# Patient Record
Sex: Female | Born: 1985
Health system: Southern US, Community
[De-identification: ages and names within clinical notes are randomized; demographics above are authoritative.]

---

## 2017-12-19 ENCOUNTER — Other Ambulatory Visit: Payer: Self-pay

## 2017-12-19 ENCOUNTER — Emergency Department (HOSPITAL_COMMUNITY)
Admission: EM | Admit: 2017-12-19 | Discharge: 2017-12-20 | Disposition: A | Discharge status: Home / Self Care | Payer: BLUE CROSS/BLUE SHIELD | Attending: Emergency Medicine | Admitting: Emergency Medicine

## 2017-12-19 ENCOUNTER — Emergency Department (HOSPITAL_COMMUNITY): Payer: BLUE CROSS/BLUE SHIELD

## 2017-12-19 ENCOUNTER — Encounter (HOSPITAL_COMMUNITY): Payer: Self-pay

## 2017-12-19 DIAGNOSIS — R319 Hematuria, unspecified: Secondary | ICD-10-CM | POA: Diagnosis not present

## 2017-12-19 DIAGNOSIS — N390 Urinary tract infection, site not specified: Secondary | ICD-10-CM | POA: Diagnosis not present

## 2017-12-19 DIAGNOSIS — N200 Calculus of kidney: Secondary | ICD-10-CM | POA: Diagnosis not present

## 2017-12-19 DIAGNOSIS — R1030 Lower abdominal pain, unspecified: Secondary | ICD-10-CM | POA: Diagnosis present

## 2017-12-19 LAB — COMPREHENSIVE METABOLIC PANEL
ALBUMIN: 4.4 g/dL (ref 3.5–5.0)
ALK PHOS: 42 U/L (ref 38–126)
ALT: 15 U/L (ref 0–44)
ANION GAP: 11 (ref 5–15)
AST: 19 U/L (ref 15–41)
BUN: 14 mg/dL (ref 6–20)
CALCIUM: 9.5 mg/dL (ref 8.9–10.3)
CHLORIDE: 100 mmol/L (ref 98–111)
CO2: 25 mmol/L (ref 22–32)
Creatinine, Ser: 0.98 mg/dL (ref 0.44–1.00)
GFR calc Af Amer: >60 mL/min (ref >60)
GFR calc non Af Amer: >60 mL/min (ref >60)
GLUCOSE: 124 mg/dL — AB (ref 70–99)
POTASSIUM: 4.4 mmol/L (ref 3.5–5.1)
SODIUM: 136 mmol/L (ref 135–145)
Total Bilirubin: 1 mg/dL (ref 0.3–1.2)
Total Protein: 7.3 g/dL (ref 6.5–8.1)

## 2017-12-19 LAB — URINALYSIS, ROUTINE W REFLEX MICROSCOPIC
Bilirubin Urine: NEGATIVE
Glucose, UA: NEGATIVE mg/dL
KETONES UR: 20 mg/dL — AB
NITRITE: NEGATIVE
PROTEIN: 30 mg/dL — AB
Specific Gravity, Urine: 1.026 (ref 1.005–1.030)
pH: 6 (ref 5.0–8.0)

## 2017-12-19 LAB — LIPASE, BLOOD: Lipase: 23 U/L (ref 11–51)

## 2017-12-19 LAB — CBC
HCT: 38.5 % (ref 36.0–46.0)
HEMOGLOBIN: 12.4 g/dL (ref 12.0–15.0)
MCH: 27.1 pg (ref 26.0–34.0)
MCHC: 32.2 g/dL (ref 30.0–36.0)
MCV: 84.2 fL (ref 78.0–100.0)
Platelets: 296 10*3/uL (ref 150–400)
RBC: 4.57 MIL/uL (ref 3.87–5.11)
RDW: 12.7 % (ref 11.5–15.5)
WBC: 14 10*3/uL — ABNORMAL HIGH (ref 4.0–10.5)

## 2017-12-19 LAB — I-STAT BETA HCG BLOOD, ED (MC, WL, AP ONLY)

## 2017-12-19 MED ORDER — HYDROMORPHONE HCL 1 MG/ML IJ SOLN
0.5000 mg | Freq: Once | INTRAMUSCULAR | Status: AC
  Administered 2017-12-20: 0.5 mg via INTRAVENOUS
  Filled 2017-12-19: qty 1

## 2017-12-19 MED ORDER — SODIUM CHLORIDE 0.9 % IV BOLUS
1000.0000 mL | Freq: Once | INTRAVENOUS | Status: AC
  Administered 2017-12-20: 1000 mL via INTRAVENOUS

## 2017-12-19 MED ORDER — ONDANSETRON HCL 4 MG/2ML IJ SOLN
4.0000 mg | Freq: Once | INTRAMUSCULAR | Status: AC
  Administered 2017-12-19: 4 mg via INTRAVENOUS
  Filled 2017-12-19: qty 2

## 2017-12-19 MED ORDER — SODIUM CHLORIDE 0.9 % IV SOLN
2.0000 g | Freq: Once | INTRAVENOUS | Status: AC
  Administered 2017-12-20: 2 g via INTRAVENOUS
  Filled 2017-12-19: qty 20

## 2017-12-19 MED ORDER — SODIUM CHLORIDE 0.9 % IV SOLN
INTRAVENOUS | Status: DC
  Administered 2017-12-19: 23:00:00 via INTRAVENOUS

## 2017-12-19 NOTE — ED Provider Notes (Signed)
Patient placed in Quick Look pathway, seen and evaluated   Chief Complaint: emesis, back pain  HPI:   Colleen James is a 32 y.o. female who presents to the ED for right side back pain that started early this morning and as the day has gone on the pain radiates to the right abdomen and now n/v. Family hx of kidney stones. Patient went to Urgent Care earlier today and was given medication for n/v. Symptoms have worsened.LMP 11/30/14, no birth control.  ROS: GI: nausea, vomiting, flank pain, right side abdominal pain  Physical Exam:  BP 133/90 (BP Location: Right Arm)   Pulse 96   Temp 98.5 F (36.9 C) (Oral)   Resp 16   Ht 5\' 1"  (1.549 m)   Wt 64 kg (141 lb)   SpO2 100%   BMI 26.64 kg/m    Gen: No distress  Neuro: Awake and Alert  Skin: Warm and dry    Initiation of care has begun. The patient has been counseled on the process, plan, and necessity for staying for the completion/evaluation, and the remainder of the medical screening examination    Janne Napoleoneese, Hope M, NP 12/19/17 2054    Gerhard MunchLockwood, Robert, MD 12/19/17 2348

## 2017-12-19 NOTE — ED Triage Notes (Addendum)
Patient c/o back pain with RLQ abdominal pain with N/V. Went to Urgent Care today and was given abx and nausea medication but has had no relief.

## 2017-12-19 NOTE — ED Provider Notes (Signed)
[facility] EMERGENCY DEPARTMENT Provider Note   CSN: [id number] Arrival date & time: 12/19/17  2043     History   Chief Complaint Chief Complaint  Patient presents with  . Back Pain    HPI Colleen James is a 32 y.o. female.  HPI Colleen James is a 33 y.o. female presents to emergency department complaining right flank pain and lower abdominal pain onset this morning.  She reports associated nausea and vomiting.  Reports some dysuria.  She went to urgent care and was diagnosed with UTI, was prescribed Macrobid, Zofran, naproxen.  Patient states she went home and was trying to take the medications but unable to keep any down.  She states she tried 2 doses of the nausea medicine with no relief.  She states pain continues.  She reports fever and chills.  Denies any vaginal discharge or bleeding.  She does state that she has history of kidney stones, and this feels similar.  No other complaints.  No past medical history on file.  There are no active problems to display for this patient.    The histories are not reviewed yet. Please review them in the "History" navigator section and refresh this SmartLink.   OB History    Gravida  1   Para      Term      Preterm      AB      Living        SAB      TAB      Ectopic      Multiple      Live Births               Home Medications    Prior to Admission medications   Not on File    Family History No family history on file.  Social History Social History   Tobacco Use  . Smoking status: Not on file  Substance Use Topics  . Alcohol use: Not on file  . Drug use: Not on file     Allergies   Ibuprofen   Review of Systems Review of Systems  Constitutional: Negative for chills and fever.  Respiratory: Negative for cough, chest tightness and shortness of breath.   Cardiovascular: Negative for chest pain, palpitations and leg swelling.  Gastrointestinal: Positive for  abdominal pain. Negative for diarrhea, nausea and vomiting.  Genitourinary: Positive for dysuria, flank pain and urgency. Negative for pelvic pain, vaginal bleeding, vaginal discharge and vaginal pain.  Musculoskeletal: Negative for arthralgias, myalgias, neck pain and neck stiffness.  Skin: Negative for rash.  Neurological: Negative for dizziness, weakness and headaches.  All other systems reviewed and are negative.    Physical Exam Updated Vital Signs BP 133/90 (BP Location: Right Arm)   Pulse 96   Temp 98.5 F (36.9 C) (Oral)   Resp 16   Ht 5\' 1"  (1.549 m)   Wt 64 kg (141 lb)   LMP 11/29/2017 (Exact Date)   SpO2 100%   BMI 26.64 kg/m   Physical Exam  Constitutional: She is oriented to person, place, and time. She appears well-developed and well-nourished.  Patient appears to be uncomfortable, tearful  HENT:  Head: Normocephalic.  Eyes: Conjunctivae are normal.  Neck: Neck supple.  Cardiovascular: Normal rate, regular rhythm and normal heart sounds.  Pulmonary/Chest: Effort normal and breath sounds normal. No respiratory distress. She has no wheezes. She has no rales.  Abdominal: Soft. Bowel sounds are normal. She exhibits no distension.  There is tenderness. There is no rebound.  Right lower quadrant tenderness.  Right CVA tenderness.  Musculoskeletal: She exhibits no edema.  Neurological: She is alert and oriented to person, place, and time.  Skin: Skin is warm and dry.  Psychiatric: She has a normal mood and affect. Her behavior is normal.  Nursing note and vitals reviewed.    ED Treatments / Results  Labs (all labs ordered are listed, but only abnormal results are displayed) Labs Reviewed  CBC - Abnormal; Notable for the following components:      Result Value   WBC 14.0 (*)    All other components within normal limits  COMPREHENSIVE METABOLIC PANEL - Abnormal; Notable for the following components:   Glucose, Bld 124 (*)    All other components within normal  limits  URINALYSIS, ROUTINE W REFLEX MICROSCOPIC - Abnormal; Notable for the following components:   APPearance CLOUDY (*)    Hgb urine dipstick MODERATE (*)    Ketones, ur 20 (*)    Protein, ur 30 (*)    Leukocytes, UA LARGE (*)    WBC, UA >50 (*)    Bacteria, UA MANY (*)    All other components within normal limits  URINE CULTURE  LIPASE, BLOOD  I-STAT BETA HCG BLOOD, ED ([facility], [facility], [facility] ONLY)    EKG None  Radiology Ct Renal Stone Study  Result Date: 12/19/2017 CLINICAL DATA:  32 year old female with acute RIGHT flank and abdominal pain with vomiting today. EXAM: CT ABDOMEN AND PELVIS WITHOUT CONTRAST TECHNIQUE: Multidetector CT imaging of the abdomen and pelvis was performed following the standard protocol without IV contrast. COMPARISON:  None. FINDINGS: Please note that parenchymal abnormalities may be missed without intravenous contrast. Lower chest: No acute abnormality. Hepatobiliary: The liver and gallbladder are unremarkable. No biliary dilatation. Pancreas: Unremarkable Spleen: Unremarkable Adrenals/Urinary Tract: A 4 mm RIGHT UVJ calculus causes mild RIGHT hydroureteronephrosis. Punctate nonobstructing bilateral renal calculi and a nonobstructing 5 mm mid RIGHT renal calculus identified. The adrenal glands and LEFT kidney are unremarkable. Stomach/Bowel: Stomach is within normal limits. Appendix appears normal. No evidence of bowel wall thickening, distention, or inflammatory changes. Vascular/Lymphatic: No significant vascular findings are present. No enlarged abdominal or pelvic lymph nodes. Reproductive: Uterus and bilateral adnexa are unremarkable. Other: No ascites, focal collection or pneumoperitoneum. Musculoskeletal: No acute or significant osseous findings. IMPRESSION: 1. 4 mm RIGHT UVJ calculus causing mild RIGHT hydroureteronephrosis. 2. Nonobstructing bilateral renal calculi Electronically Signed   By: Harmon Pier M.D.   On: 12/19/2017 23:24    Procedures Procedures  (including critical care time)  Medications Ordered in ED Medications  0.9 %  sodium chloride infusion ( Intravenous New Bag/Given 12/19/17 2236)  sodium chloride 0.9 % bolus 1,000 mL (has no administration in time range)  HYDROmorphone (DILAUDID) injection 0.5 mg (has no administration in time range)  cefTRIAXone (ROCEPHIN) 2 g in sodium chloride 0.9 % 100 mL IVPB (has no administration in time range)  ondansetron (ZOFRAN) injection 4 mg (4 mg Intravenous Given 12/19/17 2236)     Initial Impression / Assessment and Plan / ED Course  I have reviewed the triage vital signs and the nursing notes.  Pertinent labs & imaging results that were available during my care of the patient were reviewed by me and considered in my medical decision making (see chart for details).     Patient in emergency department with right flank pain and lower abdominal pain onset this morning.  Patient appears to be very uncomfortable.  Diagnosed with UTI at urgent care.  Here her blood work shows white blood cell count of 14, otherwise unremarkable.  Large leukocytes, greater than 50 white blood cells, many bacteria.  Consistent with infectious process.  Patient does also have history of kidney stones, will get CT renal for further evaluation  11:55 PM CT showing right UVJ 4mm stone. Concerning for infected stone. Discussed with urology, will come see pt.   12:37 AM Patient seen by urologist, Dr. Lucretia RoersWood.  He offered her stenting versus discharge home with giving it time to try to pass the stone.  At this time patient is afebrile, nontoxic, normal vital signs patient has chosen to try to go home.  I will give her prescription for Phenergan in addition to her Zofran.. We will start her on Cipro for infection.  We will give her pain medication.  I will have a follow-up closely with urology.  Discussed return precautions such as fever, intractable nausea vomiting, worsening pain.  Patient agreed.  Vitals:   12/19/17 2050  12/19/17 2052  BP: 133/90   Pulse: 96   Resp: 16   Temp: 98.5 F (36.9 C)   TempSrc: Oral   SpO2: 100%   Weight:  64 kg (141 lb)  Height:  5\' 1"  (1.549 m)    Final Clinical Impressions(s) / ED Diagnoses   Final diagnoses:  Kidney stone  Urinary tract infection with hematuria, site unspecified    ED Discharge Orders        Ordered    tamsulosin (FLOMAX) 0.4 MG CAPS capsule  Daily     12/20/17 0039    ciprofloxacin (CIPRO) 500 MG tablet  2 times daily     12/20/17 0039    HYDROcodone-acetaminophen (NORCO) 5-325 MG tablet  Every 6 hours PRN     12/20/17 0039    promethazine (PHENERGAN) 25 MG tablet  Every 6 hours PRN     12/20/17 0039       Jaynie CrumbleKirichenko, Niccolas Loeper, PA-C 12/20/17 0052    Gerhard MunchLockwood, Robert, MD 12/21/17 0028

## 2017-12-20 MED ORDER — PROMETHAZINE HCL 25 MG PO TABS: 25.0000 mg | ORAL_TABLET | Freq: Four times a day (QID) | ORAL | 0 refills | Status: DC | PRN

## 2017-12-20 MED ORDER — HYDROCODONE-ACETAMINOPHEN 5-325 MG PO TABS: 1.0000 | ORAL_TABLET | Freq: Four times a day (QID) | ORAL | 0 refills | Status: DC | PRN

## 2017-12-20 MED ORDER — CIPROFLOXACIN HCL 500 MG PO TABS: 500.0000 mg | ORAL_TABLET | Freq: Two times a day (BID) | ORAL | 0 refills | Status: DC

## 2017-12-20 MED ORDER — TAMSULOSIN HCL 0.4 MG PO CAPS: 0.4000 mg | ORAL_CAPSULE | Freq: Every day | ORAL | 0 refills | Status: DC

## 2017-12-20 NOTE — Consult Note (Addendum)
Urology Consult Note   Requesting Attending Physician:  Gerhard MunchLockwood, Robert, MD Service Providing Consult: Urology  Consulting Attending: Dwana Curded Sheritha Louis, MD   Reason for Consult:  Obstructing nephrolithiasis  HPI: Glennie HawkKalaiyarasi Tennyson is seen in consultation for reasons noted above at the request of Gerhard MunchLockwood, Robert, MD for evaluation of right sided obstructing nephrolithiasis.  This is a 32 y.o. female with history of nephrolithiasis who presents with right sided flank pain and associated nausea/vomiting that began early yesterday morning. She was seen at an outside facility where she was prescribed Macrobid as it was thought she may have a UTI.   She later presented to the ED where a CT scan was obtained which revealed a 4mm obstructing stone at the right UVJ with associated mild hydroureteronephrosis. She was given a dose of CTX and Urology was then consulted for assistance with management.  Patient specifically denies fevers. She also denies dysuria. She does report some urinary frequency/urgency that began today. She describes her pain as well controlled at this time.  Patient reports similar episode several years prior in which she spontaneously passed her stone with the use of Flomax. This has been her only prior stone episode. She reports family history of stones. She does not take any medications.   Past Medical History: No past medical history on file.  Past Surgical History:  The histories are not reviewed yet. Please review them in the "History" navigator section and refresh this SmartLink.  Medication: Current Facility-Administered Medications  Medication Dose Route Frequency Provider Last Rate Last Dose  . 0.9 %  sodium chloride infusion   Intravenous Continuous Kerrie Buffaloeese, Hope M, NP 125 mL/hr at 12/19/17 2236    . cefTRIAXone (ROCEPHIN) 2 g in sodium chloride 0.9 % 100 mL IVPB  2 g Intravenous Once Kirichenko, Tatyana, PA-C 200 mL/hr at 12/20/17 0007 2 g at 12/20/17 0007  . sodium  chloride 0.9 % bolus 1,000 mL  1,000 mL Intravenous Once Jaynie CrumbleKirichenko, Tatyana, PA-C 984 mL/hr at 12/20/17 0006 1,000 mL at 12/20/17 0006   No current outpatient medications on file.    Allergies: Allergies  Allergen Reactions  . Ibuprofen     GI upset    Social History: Social History   Tobacco Use  . Smoking status: Not on file  Substance Use Topics  . Alcohol use: Not on file  . Drug use: Not on file    Family History No family history on file.  Review of Systems 10 systems were reviewed and are negative except as noted specifically in the HPI.  Objective   Vital signs in last 24 hours: BP 133/90 (BP Location: Right Arm)   Pulse 96   Temp 98.5 F (36.9 C) (Oral)   Resp 16   Ht 5\' 1"  (1.549 m)   Wt 64 kg (141 lb)   LMP 11/29/2017 (Exact Date)   SpO2 100%   BMI 26.64 kg/m   Physical Exam General: NAD, A&O, resting, appropriate HEENT: [place]/AT, EOMI, MMM Pulmonary: Normal work of breathing Cardiovascular: HDS, adequate peripheral perfusion Abdomen: Soft, NTTP, nondistended GU: mild RLQ pain, no CVAT Extremities: warm and well perfused Neuro: Appropriate, no focal neurological deficits  Most Recent Labs: Lab Results  Component Value Date   WBC 14.0 (H) 12/19/2017   HGB 12.4 12/19/2017   HCT 38.5 12/19/2017   PLT 296 12/19/2017    Lab Results  Component Value Date   NA 136 12/19/2017   K 4.4 12/19/2017   CL 100 12/19/2017   CO2 25  12/19/2017   BUN 14 12/19/2017   CREATININE 0.98 12/19/2017   CALCIUM 9.5 12/19/2017    No results found for: INR, APTT   IMAGING: Ct Renal Stone Study  Result Date: 12/19/2017 CLINICAL DATA:  32 year old female with acute RIGHT flank and abdominal pain with vomiting today. EXAM: CT ABDOMEN AND PELVIS WITHOUT CONTRAST TECHNIQUE: Multidetector CT imaging of the abdomen and pelvis was performed following the standard protocol without IV contrast. COMPARISON:  None. FINDINGS: Please note that parenchymal abnormalities may  be missed without intravenous contrast. Lower chest: No acute abnormality. Hepatobiliary: The liver and gallbladder are unremarkable. No biliary dilatation. Pancreas: Unremarkable Spleen: Unremarkable Adrenals/Urinary Tract: A 4 mm RIGHT UVJ calculus causes mild RIGHT hydroureteronephrosis. Punctate nonobstructing bilateral renal calculi and a nonobstructing 5 mm mid RIGHT renal calculus identified. The adrenal glands and LEFT kidney are unremarkable. Stomach/Bowel: Stomach is within normal limits. Appendix appears normal. No evidence of bowel wall thickening, distention, or inflammatory changes. Vascular/Lymphatic: No significant vascular findings are present. No enlarged abdominal or pelvic lymph nodes. Reproductive: Uterus and bilateral adnexa are unremarkable. Other: No ascites, focal collection or pneumoperitoneum. Musculoskeletal: No acute or significant osseous findings. IMPRESSION: 1. 4 mm RIGHT UVJ calculus causing mild RIGHT hydroureteronephrosis. 2. Nonobstructing bilateral renal calculi Electronically Signed   By: Harmon Pier M.D.   On: 12/19/2017 23:24    ------  Assessment:  32 y.o. female with history of nephrolithiasis presenting with right sided flank pain and CT evidence of 4mm right UVJ stone with associated mild hydroureteronephrosis. Additionally patient with nonobstructing bilateral renal calculi and a nonobstructing 5 mm interpole right renal calculus.  Patient afebrile and HDS. Non-toxic appearing. Pain well controlled at this time. Mild leukocytosis of 14. UA with negative nitrites, large LE, 21-50 RBCs, >50 WBCs, 11-20 squams, many bacteria. UCx pending. No dysuria. Cr WNL.  Discussed options at this time include intervention with placement of right ureteral stent versus trial of passage. If right ureteral stent placed, she would requre ureteroscopic stone extraction at a later date. We discussed risks and benefits of each option in detail. At this time, she prefers trial of  passage with medical expulsive therapy. Strict return precautions were discussed with patient as described below. She expressed understanding.   Recommendations: - OK to discharge home with PO pain medication, Flomax, aggressive hydration and urine strainer. - Follow up results of UCx, recommend 7 day empiric course of treatment antibiotics pending culture results. - Discussed with patient that strict return precautions include fevers/chills, intractable nausea/vomiting, or pain limiting self care. - Urology to arrange for close outpatient follow up with repeat imaging prior.  Discussed with Dr. Berneice Heinrich.  Thank you for this consult. Please contact the urology consult pager with any further questions/concerns.   Imaging and labs reviewed with Dr. Lucretia Roers. Strong return warning given for new fevers / progresisve malaise othersie proceed with trial of medical therapy given size / location of stone.

## 2017-12-20 NOTE — ED Notes (Signed)
Dr. Wood at bedside

## 2017-12-20 NOTE — ED Notes (Signed)
Signature pad not working, pt verbalized discharge instructions and prescriptions.  No questions at this time.

## 2017-12-20 NOTE — Discharge Instructions (Addendum)
Strain your urine.  Take ibuprofen for pain.  Take Norco for severe pain.  Zofran for nausea and vomiting that was prescribed to you by urgent care.  Phenergan in addition for nausea relief.  Flomax daily to help you pass the stone.  Take Cipro for infection.  Follow-up closely with urologist.  [facility] if fever, unable to keep antibiotics down, worsening pain, any new concerning symptoms

## 2017-12-21 LAB — URINE CULTURE: Culture: NO GROWTH

## 2018-01-26 DIAGNOSIS — L700 Acne vulgaris: Secondary | ICD-10-CM | POA: Insufficient documentation

## 2018-01-26 DIAGNOSIS — K580 Irritable bowel syndrome with diarrhea: Secondary | ICD-10-CM | POA: Insufficient documentation

## 2018-01-26 DIAGNOSIS — L219 Seborrheic dermatitis, unspecified: Secondary | ICD-10-CM | POA: Insufficient documentation

## 2018-08-20 DIAGNOSIS — M5431 Sciatica, right side: Secondary | ICD-10-CM | POA: Insufficient documentation

## 2019-10-09 ENCOUNTER — Ambulatory Visit: Payer: BLUE CROSS/BLUE SHIELD

## 2019-11-19 IMAGING — CT CT RENAL STONE PROTOCOL
2 of 4 series · 16 of 46 positions shown, 18 images · non-contrast
Comparison: None.

CLINICAL DATA: 32-year-old female with acute RIGHT flank and
abdominal pain with vomiting today.

EXAM:
CT ABDOMEN AND PELVIS WITHOUT CONTRAST
TECHNIQUE: Multidetector CT imaging of the abdomen and pelvis was performed
following the standard protocol without IV contrast.

[Series 3: stone study 5.0 i30f 2 · axial · 0.65mm/px · z∈[-481,-111]mm · 13 of 82 slices shown, 15 images]
[im 4/82  soft-tissue]
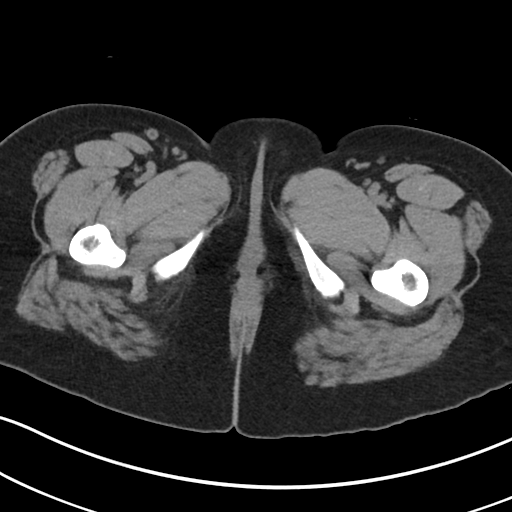
[im 4/82  bone]
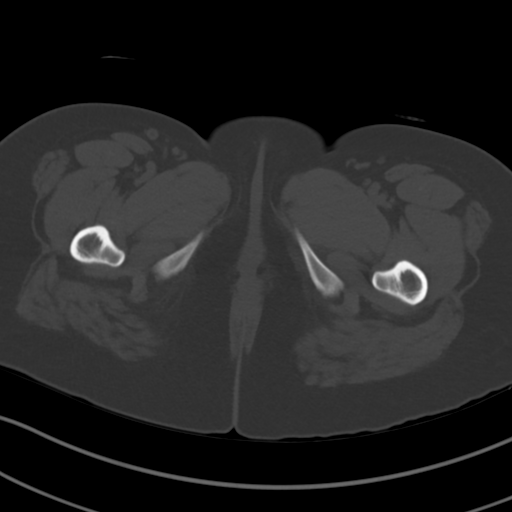
[im 10/82  soft-tissue]
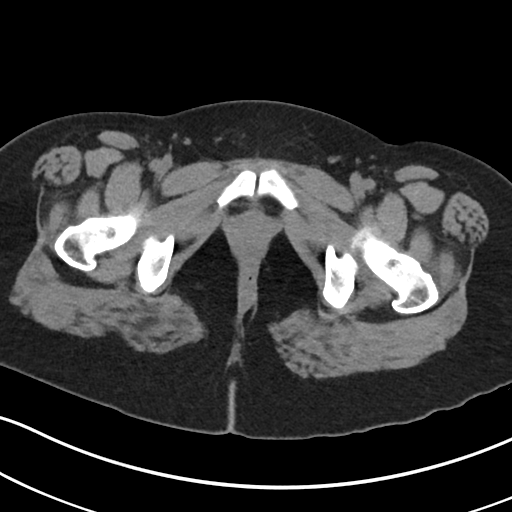
[im 16/82  soft-tissue]
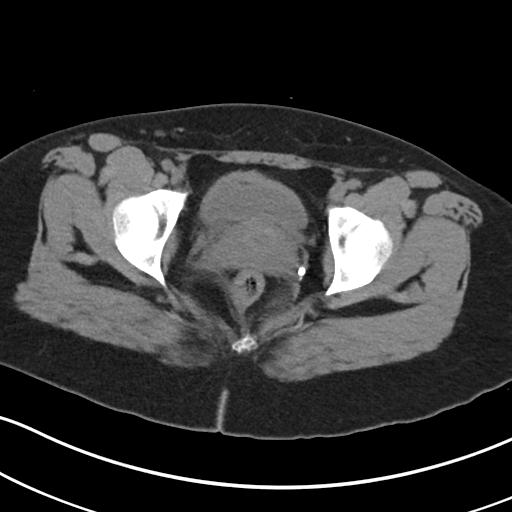
[im 22/82  soft-tissue]
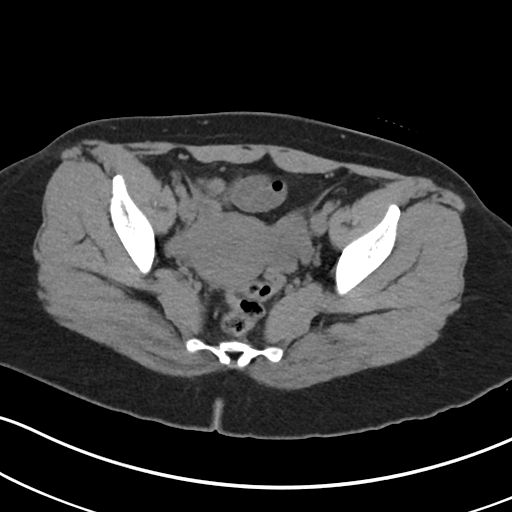
[im 29/82  soft-tissue]
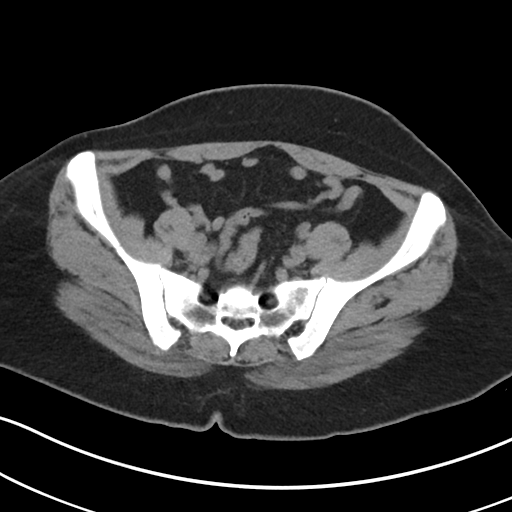
[im 35/82  soft-tissue]
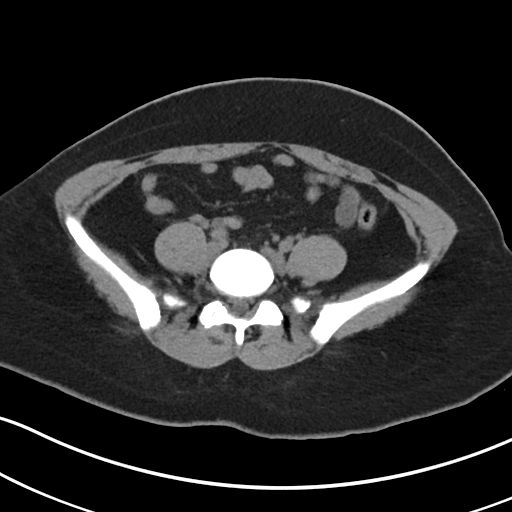
[im 41/82  soft-tissue]
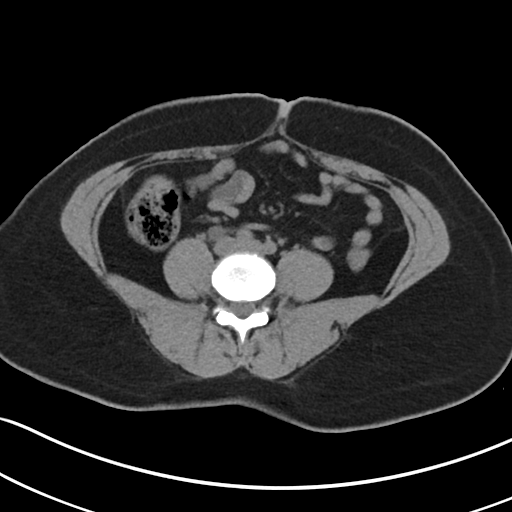
[im 47/82  soft-tissue]
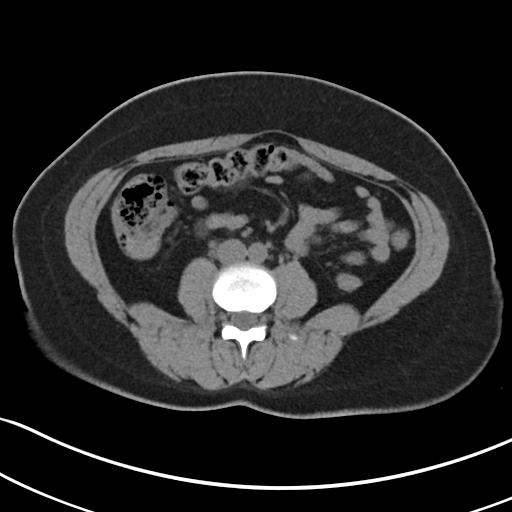
[im 53/82  soft-tissue]
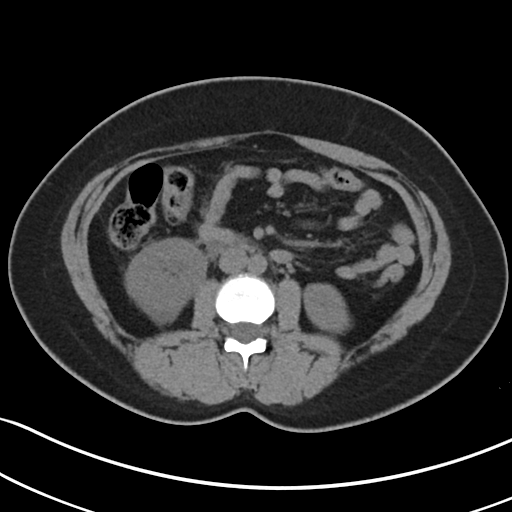
[im 53/82  bone]
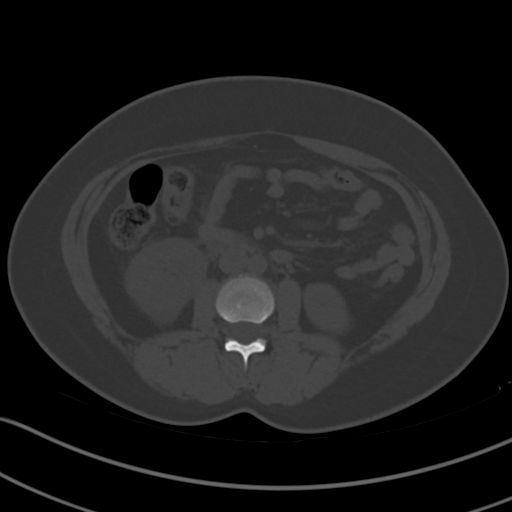
[im 60/82  soft-tissue]
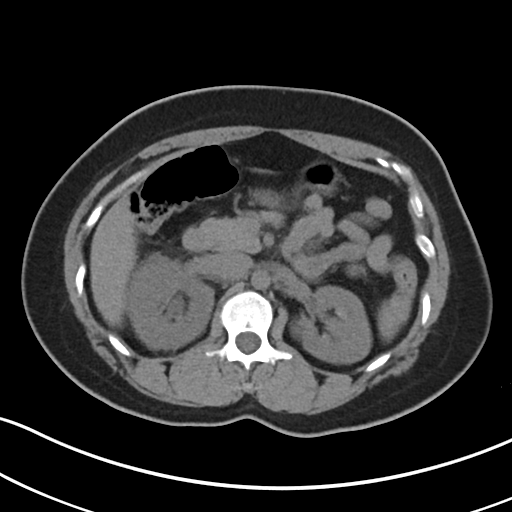
[im 66/82  soft-tissue]
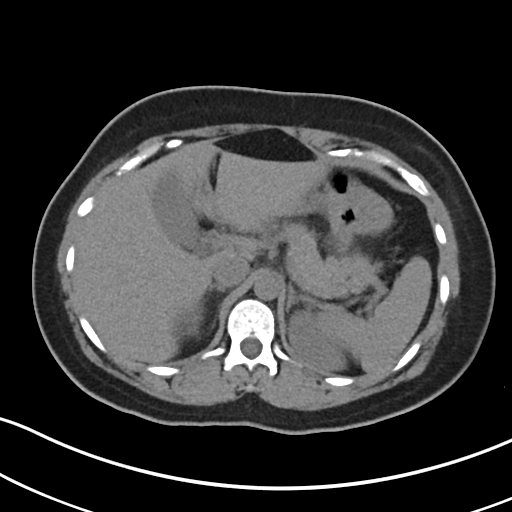
[im 72/82  soft-tissue]
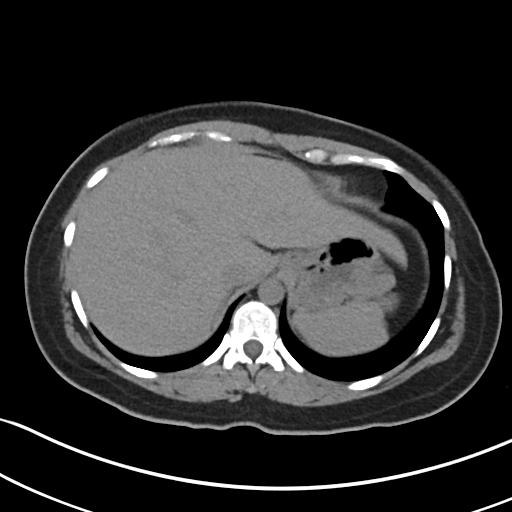
[im 78/82  soft-tissue]
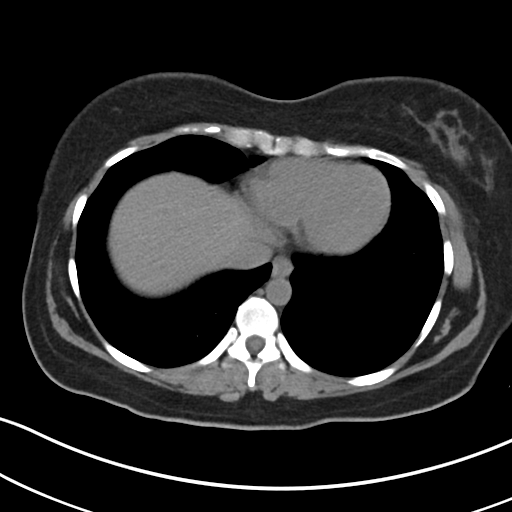

[Series 6: coronal soft tissue · coronal · 0.69mm/px · 3 of 86 slices shown]
[im 29/86  soft-tissue]
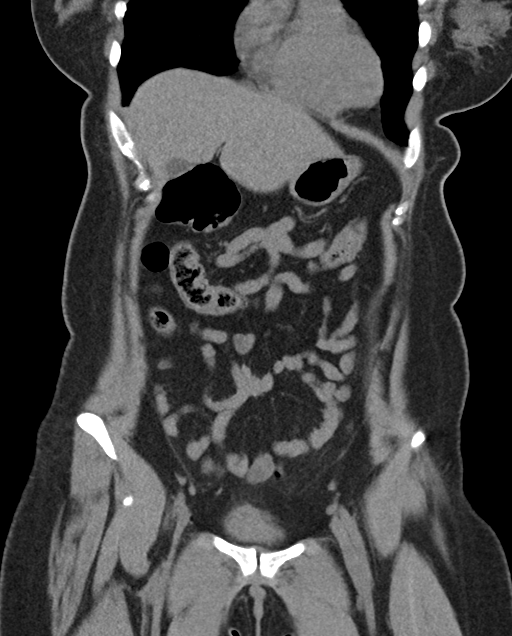
[im 38/86  soft-tissue]
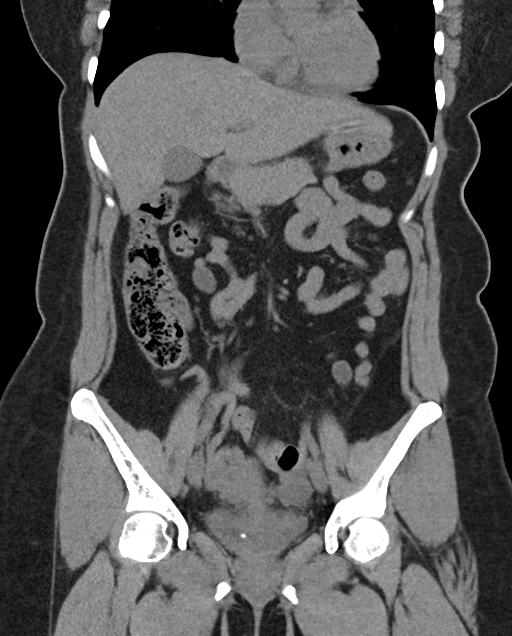
[im 48/86  soft-tissue]
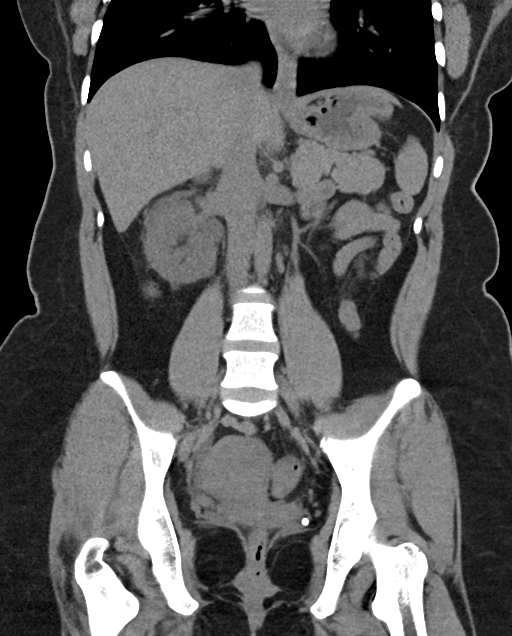

[16 of 46 positions shown; findings below may reference images not displayed]

FINDINGS: Please note that parenchymal abnormalities may be missed without
intravenous contrast.

Lower chest: No acute abnormality.

Hepatobiliary: The liver and gallbladder are unremarkable. No
biliary dilatation.

Pancreas: Unremarkable

Spleen: Unremarkable

Adrenals/Urinary Tract: A 4 mm RIGHT UVJ calculus causes mild RIGHT
hydroureteronephrosis. Punctate nonobstructing bilateral renal
calculi and a nonobstructing 5 mm mid RIGHT renal calculus
identified.

The adrenal glands and LEFT kidney are unremarkable.

Stomach/Bowel: Stomach is within normal limits. Appendix appears
normal. No evidence of bowel wall thickening, distention, or
inflammatory changes.

Vascular/Lymphatic: No significant vascular findings are present. No
enlarged abdominal or pelvic lymph nodes.

Reproductive: Uterus and bilateral adnexa are unremarkable.

Other: No ascites, focal collection or pneumoperitoneum.

Musculoskeletal: No acute or significant osseous findings.
IMPRESSION: 1. 4 mm RIGHT UVJ calculus causing mild RIGHT hydroureteronephrosis.
2. Nonobstructing bilateral renal calculi

## 2020-04-20 ENCOUNTER — Other Ambulatory Visit: Payer: Self-pay | Admitting: Urology

## 2020-04-30 ENCOUNTER — Other Ambulatory Visit (HOSPITAL_COMMUNITY)
Admission: RE | Admit: 2020-04-30 | Discharge: 2020-04-30 | Disposition: A | Discharge status: Home / Self Care | Payer: BC Managed Care – PPO | Source: Ambulatory Visit | Attending: Urology | Admitting: Urology

## 2020-04-30 DIAGNOSIS — Z20822 Contact with and (suspected) exposure to covid-19: Secondary | ICD-10-CM | POA: Diagnosis not present

## 2020-04-30 DIAGNOSIS — Z01812 Encounter for preprocedural laboratory examination: Secondary | ICD-10-CM | POA: Diagnosis present

## 2020-05-01 ENCOUNTER — Encounter (HOSPITAL_BASED_OUTPATIENT_CLINIC_OR_DEPARTMENT_OTHER): Payer: Self-pay | Admitting: Urology

## 2020-05-01 LAB — SARS CORONAVIRUS 2 (TAT 6-24 HRS): SARS Coronavirus 2: NEGATIVE

## 2020-05-01 NOTE — Progress Notes (Signed)
Talked with patient. Instructions given Arrival time 0800 Npo after MN except cl liquids until 0600. Husband is the driver

## 2020-05-04 ENCOUNTER — Encounter (HOSPITAL_BASED_OUTPATIENT_CLINIC_OR_DEPARTMENT_OTHER)
Admission: RE | Disposition: A | Discharge status: Home / Self Care | Payer: Self-pay | Source: Home / Self Care | Attending: Urology

## 2020-05-04 ENCOUNTER — Other Ambulatory Visit: Payer: Self-pay

## 2020-05-04 ENCOUNTER — Encounter (HOSPITAL_BASED_OUTPATIENT_CLINIC_OR_DEPARTMENT_OTHER): Payer: Self-pay | Admitting: Urology

## 2020-05-04 ENCOUNTER — Ambulatory Visit (HOSPITAL_COMMUNITY): Payer: BC Managed Care – PPO

## 2020-05-04 ENCOUNTER — Ambulatory Visit (HOSPITAL_BASED_OUTPATIENT_CLINIC_OR_DEPARTMENT_OTHER)
Admission: RE | Admit: 2020-05-04 | Discharge: 2020-05-04 | Disposition: A | Discharge status: Home / Self Care | Payer: BC Managed Care – PPO | Attending: Urology | Admitting: Urology

## 2020-05-04 DIAGNOSIS — N200 Calculus of kidney: Secondary | ICD-10-CM | POA: Insufficient documentation

## 2020-05-04 HISTORY — PX: EXTRACORPOREAL SHOCK WAVE LITHOTRIPSY: SHX1557

## 2020-05-04 LAB — POCT PREGNANCY, URINE: Preg Test, Ur: NEGATIVE

## 2020-05-04 SURGERY — LITHOTRIPSY, ESWL
Anesthesia: LOCAL | Laterality: Right

## 2020-05-04 MED ORDER — SODIUM CHLORIDE 0.9 % IV SOLN: INTRAVENOUS | Status: DC

## 2020-05-04 MED ORDER — CIPROFLOXACIN HCL 500 MG PO TABS
500.0000 mg | ORAL_TABLET | ORAL | Status: AC
  Administered 2020-05-04: 500 mg via ORAL

## 2020-05-04 MED ORDER — DIPHENHYDRAMINE HCL 25 MG PO CAPS
ORAL_CAPSULE | ORAL | Status: AC
  Filled 2020-05-04: qty 1

## 2020-05-04 MED ORDER — ONDANSETRON HCL 4 MG/2ML IJ SOLN
INTRAMUSCULAR | Status: AC
  Filled 2020-05-04: qty 2

## 2020-05-04 MED ORDER — DIPHENHYDRAMINE HCL 25 MG PO CAPS
25.0000 mg | ORAL_CAPSULE | ORAL | Status: AC
  Administered 2020-05-04: 25 mg via ORAL

## 2020-05-04 MED ORDER — CIPROFLOXACIN HCL 500 MG PO TABS
ORAL_TABLET | ORAL | Status: AC
  Filled 2020-05-04: qty 1

## 2020-05-04 MED ORDER — DIAZEPAM 5 MG PO TABS
ORAL_TABLET | ORAL | Status: AC
  Filled 2020-05-04: qty 2

## 2020-05-04 MED ORDER — HYDROCODONE-ACETAMINOPHEN 5-325 MG PO TABS: 1.0000 | ORAL_TABLET | Freq: Four times a day (QID) | ORAL | 0 refills | Status: DC | PRN

## 2020-05-04 MED ORDER — HYDROCODONE-ACETAMINOPHEN 5-325 MG PO TABS: 1.0000 | ORAL_TABLET | ORAL | 0 refills | Status: DC | PRN

## 2020-05-04 MED ORDER — ONDANSETRON HCL 4 MG/2ML IJ SOLN
4.0000 mg | Freq: Once | INTRAMUSCULAR | Status: AC
  Administered 2020-05-04: 4 mg via INTRAVENOUS

## 2020-05-04 MED ORDER — DIAZEPAM 5 MG PO TABS
10.0000 mg | ORAL_TABLET | ORAL | Status: AC
  Administered 2020-05-04: 10 mg via ORAL

## 2020-05-04 NOTE — H&P (Signed)
See scanned H&P

## 2020-05-04 NOTE — Op Note (Signed)
[facility] Operative Note  Treating Physician: Rhoderick Moody, MD  Pre-op diagnosis: 6 mm right renal stone  Post-op diagnosis: Same   Procedure: RIGHT ESWL  See Rojelio Brenner OP note scanned into chart. Also because of the size, density, location and other factors that cannot be anticipated I feel this will likely be a staged procedure. This fact supersedes any indication in the scanned [place] stone operative note to the contrary

## 2020-05-05 ENCOUNTER — Encounter (HOSPITAL_BASED_OUTPATIENT_CLINIC_OR_DEPARTMENT_OTHER): Payer: Self-pay | Admitting: Urology

## 2020-07-23 DIAGNOSIS — E559 Vitamin D deficiency, unspecified: Secondary | ICD-10-CM | POA: Insufficient documentation

## 2021-06-22 DIAGNOSIS — N181 Chronic kidney disease, stage 1: Secondary | ICD-10-CM | POA: Insufficient documentation

## 2022-04-04 IMAGING — DX DG ABDOMEN 1V
1 series · 1 of 1 positions shown · non-contrast
Comparison: 04/20/2020 outside study

CLINICAL DATA: Right kidney stone, lithotripsy planned

EXAM:
ABDOMEN - 1 VIEW

[abdomen kub]
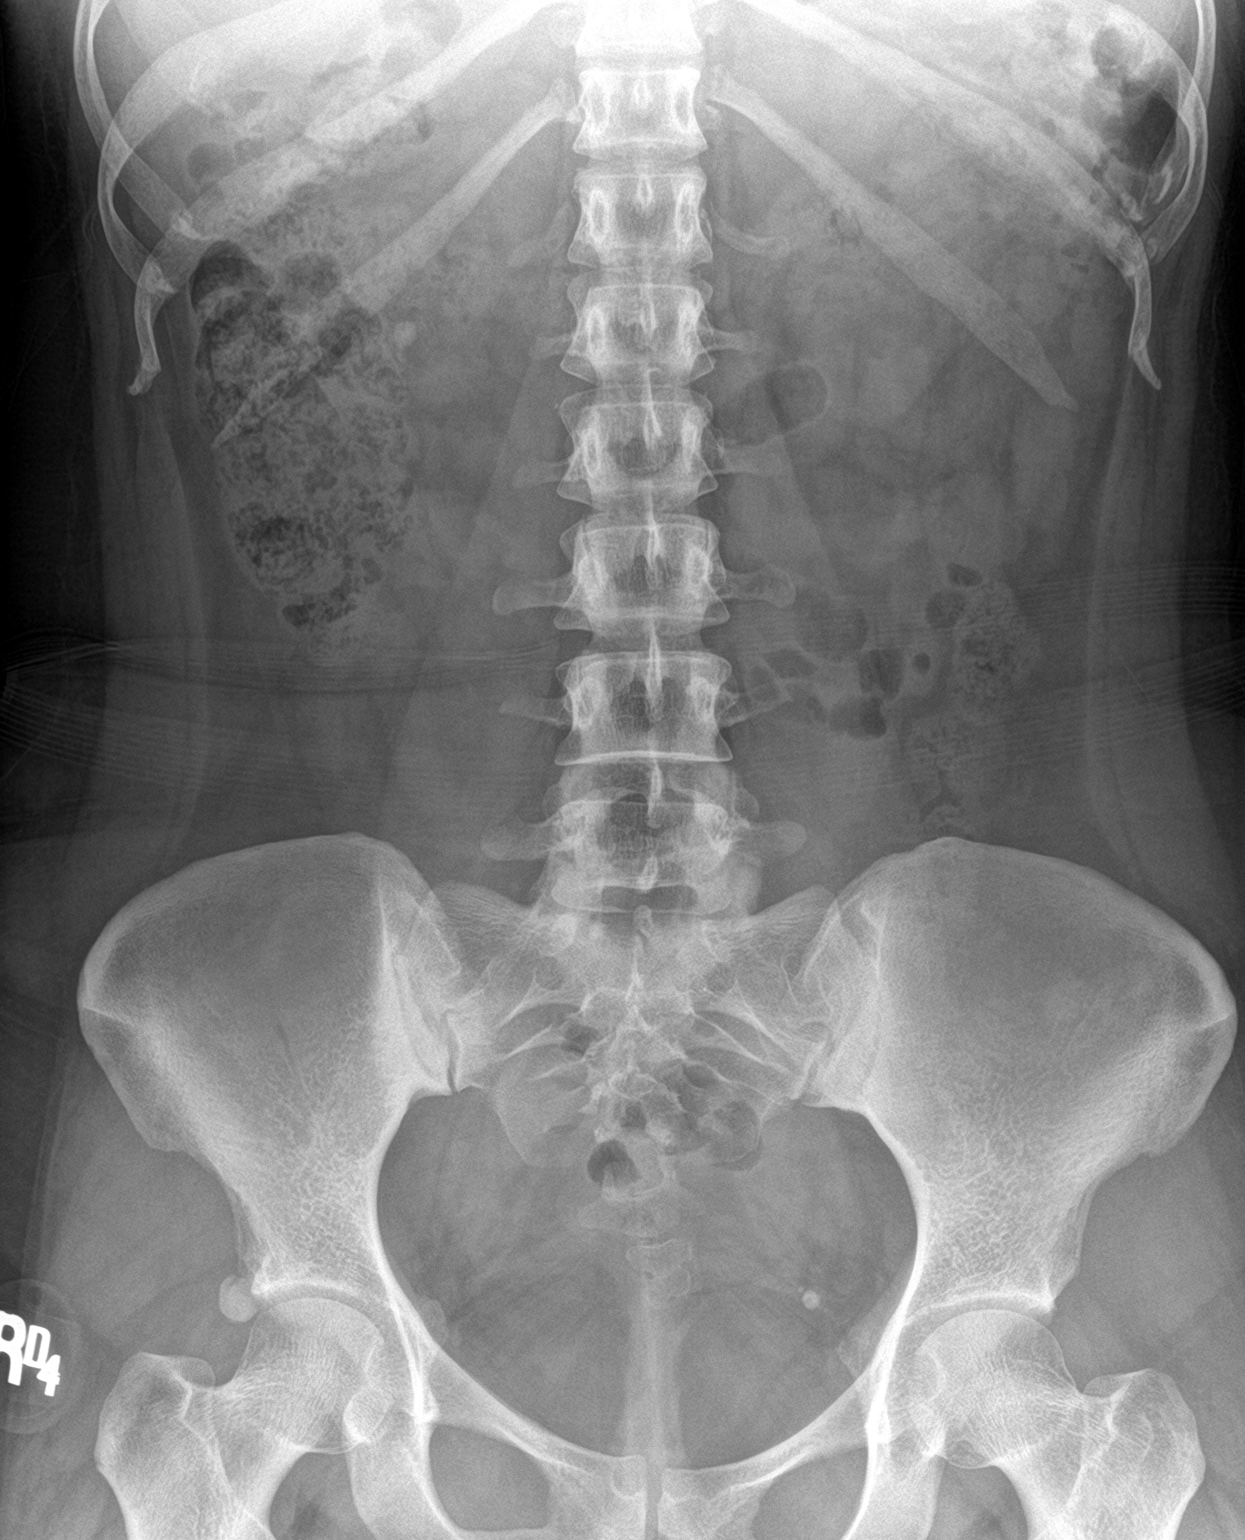

[1 of 1 positions shown; findings below may reference images not displayed]

FINDINGS: A 5 mm calculus is again identified in the interpolar region of the
right kidney. No new findings. Bowel gas pattern is unremarkable. No
acute osseous abnormality.
IMPRESSION: Unchanged 5 mm right renal calculus.

## 2022-06-15 ENCOUNTER — Encounter: Payer: Self-pay | Admitting: Family

## 2022-06-15 ENCOUNTER — Ambulatory Visit (INDEPENDENT_AMBULATORY_CARE_PROVIDER_SITE_OTHER): Payer: BC Managed Care – PPO | Admitting: Family

## 2022-06-15 ENCOUNTER — Other Ambulatory Visit (HOSPITAL_COMMUNITY)
Admission: RE | Admit: 2022-06-15 | Discharge: 2022-06-15 | Disposition: A | Discharge status: Home / Self Care | Payer: BC Managed Care – PPO | Source: Ambulatory Visit | Attending: Family | Admitting: Family

## 2022-06-15 VITALS — BP 142/87 | HR 74 | Temp 98.0°F | Ht 62.0 in | Wt 153.4 lb

## 2022-06-15 DIAGNOSIS — R030 Elevated blood-pressure reading, without diagnosis of hypertension: Secondary | ICD-10-CM

## 2022-06-15 DIAGNOSIS — Z0000 Encounter for general adult medical examination without abnormal findings: Secondary | ICD-10-CM | POA: Diagnosis present

## 2022-06-15 LAB — CBC WITH DIFFERENTIAL/PLATELET
Basophils Absolute: 0.1 10*3/uL (ref 0.0–0.1)
Basophils Relative: 0.7 % (ref 0.0–3.0)
Eosinophils Absolute: 0.2 10*3/uL (ref 0.0–0.7)
Eosinophils Relative: 1.9 % (ref 0.0–5.0)
HCT: 36.8 % (ref 36.0–46.0)
Hemoglobin: 12 g/dL (ref 12.0–15.0)
Lymphocytes Relative: 34.7 % (ref 12.0–46.0)
Lymphs Abs: 3.1 10*3/uL (ref 0.7–4.0)
MCHC: 32.5 g/dL (ref 30.0–36.0)
MCV: 80.4 fl (ref 78.0–100.0)
Monocytes Absolute: 0.6 10*3/uL (ref 0.1–1.0)
Monocytes Relative: 6.3 % (ref 3.0–12.0)
Neutro Abs: 5 10*3/uL (ref 1.4–7.7)
Neutrophils Relative %: 56.4 % (ref 43.0–77.0)
Platelets: 360 10*3/uL (ref 150.0–400.0)
RBC: 4.58 Mil/uL (ref 3.87–5.11)
RDW: 13.9 % (ref 11.5–15.5)
WBC: 8.9 10*3/uL (ref 4.0–10.5)

## 2022-06-15 LAB — COMPREHENSIVE METABOLIC PANEL
ALT: 9 U/L (ref 0–35)
AST: 11 U/L (ref 0–37)
Albumin: 4.2 g/dL (ref 3.5–5.2)
Alkaline Phosphatase: 49 U/L (ref 39–117)
BUN: 13 mg/dL (ref 6–23)
CO2: 27 mEq/L (ref 19–32)
Calcium: 9.2 mg/dL (ref 8.4–10.5)
Chloride: 104 mEq/L (ref 96–112)
Creatinine, Ser: 0.72 mg/dL (ref 0.40–1.20)
GFR: 107.97 mL/min (ref >60.00)
Glucose, Bld: 92 mg/dL (ref 70–99)
Potassium: 4 mEq/L (ref 3.5–5.1)
Sodium: 138 mEq/L (ref 135–145)
Total Bilirubin: 0.5 mg/dL (ref 0.2–1.2)
Total Protein: 7 g/dL (ref 6.0–8.3)

## 2022-06-15 LAB — LIPID PANEL
Cholesterol: 209 mg/dL — ABNORMAL HIGH (ref 0–200)
HDL: 43 mg/dL (ref >39.00)
LDL Cholesterol: 144 mg/dL — ABNORMAL HIGH (ref 0–99)
NonHDL: 165.89
Total CHOL/HDL Ratio: 5
Triglycerides: 111 mg/dL (ref 0.0–149.0)
VLDL: 22.2 mg/dL (ref 0.0–40.0)

## 2022-06-15 LAB — TSH: TSH: 1.48 u[IU]/mL (ref 0.35–5.50)

## 2022-06-15 NOTE — Progress Notes (Signed)
Phone [phone]  Subjective:   Patient is a 36 y.o. female presenting for annual physical.    Chief Complaint  Patient presents with   New Patient (Initial Visit)   Annual Exam    Fasting w/ Labs    See problem oriented charting- ROS- full  review of systems was completed and negative  The following were reviewed and entered/updated in epic: History reviewed. No pertinent past medical history. Patient Active Problem List   Diagnosis Date Noted   Chronic kidney disease (CKD) stage G1/A2, glomerular filtration rate (GFR) equal to or greater than 90 mL/min/1.73 square meter and albuminuria creatinine ratio between 30-299 mg/g 06/22/2021   Vitamin D insufficiency 07/23/2020   Bilateral sciatica 08/20/2018   Comedonal acne 01/26/2018   Irritable bowel syndrome with diarrhea 01/26/2018   Seborrheic dermatitis of scalp 01/26/2018   Past Surgical History:  Procedure Laterality Date   EXTRACORPOREAL SHOCK WAVE LITHOTRIPSY Right 05/04/2020   Procedure: EXTRACORPOREAL SHOCK WAVE LITHOTRIPSY (ESWL);  Surgeon: Rene Paci, MD;  Location: [facility];  Service: Urology;  Laterality: Right;    History reviewed. No pertinent family history.  Medications- reviewed and updated No current outpatient medications on file.   No current facility-administered medications for this visit.    Allergies-reviewed and updated Allergies  Allergen Reactions   Ibuprofen     GI upset    Social History   Social History Narrative   Not on file    Objective:  BP (!) 142/87 (BP Location: Left Arm, Patient Position: Sitting, Cuff Size: Large)   Pulse 74   Temp 98 F (36.7 C) (Temporal)   Ht 5\' 2"  (1.575 m)   Wt 153 lb 6 oz (69.6 kg)   LMP 06/04/2022 (Exact Date)   SpO2 100%   Breastfeeding No   BMI 28.05 kg/m  Physical Exam Vitals and nursing note reviewed. Exam conducted with a chaperone present.  Constitutional:      Appearance: Normal appearance.   HENT:     Head: Normocephalic.     Right Ear: Tympanic membrane normal.     Left Ear: Tympanic membrane normal.     Nose: Nose normal.     Mouth/Throat:     Mouth: Mucous membranes are moist.  Eyes:     Pupils: Pupils are equal, round, and reactive to light.  Cardiovascular:     Rate and Rhythm: Normal rate and regular rhythm.  Pulmonary:     Effort: Pulmonary effort is normal.     Breath sounds: Normal breath sounds.  Genitourinary:    Exam position: Lithotomy position.     Pubic Area: No rash or pubic lice.      Vagina: Normal.     Comments: Unable to visualize cervix, able to see mild leukorrhea, pt  would not relax enough to open speculum more than slightly, unsure if adequate sample. Musculoskeletal:        General: Normal range of motion.     Cervical back: Normal range of motion.  Lymphadenopathy:     Cervical: No cervical adenopathy.  Skin:    General: Skin is warm and dry.  Neurological:     Mental Status: She is alert.  Psychiatric:        Mood and Affect: Mood normal.        Behavior: Behavior normal.      Assessment and Plan   Health Maintenance counseling: 1. Anticipatory guidance: Patient counseled regarding regular dental exams q6 months, eye exams,  avoiding smoking  and second hand smoke, limiting alcohol to 1 beverage per day, no illicit drugs.   2. Risk factor reduction:  Advised patient of need for regular exercise and diet rich with fruits and vegetables to reduce risk of heart attack and stroke. Wt Readings from Last 3 Encounters:  06/15/22 153 lb 6 oz (69.6 kg)  05/04/20 141 lb 4.8 oz (64.1 kg)  12/19/17 141 lb (64 kg)   3. Immunizations/screenings/ancillary studies Immunization History  Administered Date(s) Administered   Influenza Inj Mdck Quad Pf 04/22/2015, 04/08/2017, 05/10/2019, 04/06/2020, 03/29/2021   Influenza,inj,Quad PF,6+ Mos 05/11/2022   Moderna Sars-Covid-2 Vaccination 09/19/2019, 10/10/2019   Tdap 10/20/2014   Health  Maintenance Due  Topic Date Due   HIV Screening  Never done   Hepatitis C Screening  Never done   PAP SMEAR-Modifier  Never done    4. Cervical cancer screening: today 5. Skin cancer screening- advised regular sunscreen use. Denies worrisome, changing, or new skin lesions.  6. Birth control/STD check: N/A 7. Smoking associated screening: non- smoker 8. Alcohol screening: rare  Problem List Items Addressed This Visit   None Visit Diagnoses     Annual physical exam    -  Primary pt extremely nervous re PAP, unable to get good specimen, may have to repeat in GYN office, may require sedation. pt denies any pain or previous trauma r/t sexual intercourse.    Relevant Orders   Cytology - PAP   Comprehensive metabolic panel   TSH   Lipid panel   CBC with Differential/Platelet   Elevated blood pressure reading    - pt believes she her recent holiday stress is contributing to sx, she still has houseguests. Denies any sx. Advised on increasing water intake, low sodium diet, exercise, handout also provided.    Recommended follow up:  No follow-ups on file. No future appointments.  Lab/Order associations:fasting    Jeanie Sewer, NP

## 2022-06-15 NOTE — Patient Instructions (Addendum)
Welcome to [facility], It was a pleasure meeting you today!   I will review your lab results via MyChart in a few days.  Keep an eye on your blood pressure, as discussed normal is 110-120/60-80. Random high readings are ok, but if you see it elevated every week then medication may be needed.  Drink at least 2 liters of water daily, eat a low sodium diet, and exercise for 20-30 minutes as many days as possible. See the attached handout for more information.  Happy New Year!     PLEASE NOTE: If you had any LAB tests please let us know if you have not heard back within a few days. You may see your results on MyChart before we have a chance to review them but we will give you a call once they are reviewed by [place]. If we ordered any REFERRALS today, please let us know if you have not heard from their office within the next week.  Let us know through MyChart if you are needing REFILLS, or have your pharmacy send [place] the request. You can also use MyChart to communicate with me or any office staff.  Please try these tips to maintain a healthy lifestyle: It is important that you exercise regularly at least 30 minutes 5 times a week. Think about what you will eat, plan ahead. Choose whole foods, & think  "clean, green, fresh or frozen" over canned, processed or packaged foods which are more sugary, salty, and fatty. 70 to 75% of food eaten should be fresh vegetables and protein. 2-3  meals daily with healthy snacks between meals, but must be whole fruit, protein or vegetables. Aim to eat over a 10 hour period when you are active, for example, 7am to 5pm, and then STOP after your last meal of the day, drinking only water.  Shorter eating windows, 6-8 hours, are showing benefits in heart disease and blood sugar regulation. Drink water every day! Shoot for 64 ounces daily = 8 cups, no other drink is as healthy! Fruit juice is best enjoyed in a healthy way, by EATING the  fruit.

## 2022-06-17 LAB — CYTOLOGY - PAP: Diagnosis: NEGATIVE

## 2022-06-21 NOTE — Progress Notes (Signed)
Your glucose, electrolytes, blood count, thyroid, liver & kidney function are all normal. Good news, we had enough cervical cells for your PAP smear testing and you are negative! Retesting recommended in 3 years.  Your total cholesterol number & LDL (bad #) are high. Need to reduce any fried foods, alcohol, nonnutritional snacks e.g. chips/cookies,pies, cakes and candies, fatty meat (red meat), high fat dairy foods:  including cheese, milk, ice cream.  Increase fruits/vegetables/fiber.   Continue or restart an exercise routine, shooting for 81min 5-7days per week.   Any questions, let me know, take care!

## 2023-06-08 ENCOUNTER — Encounter: Payer: Self-pay | Admitting: Family

## 2023-06-08 ENCOUNTER — Ambulatory Visit (INDEPENDENT_AMBULATORY_CARE_PROVIDER_SITE_OTHER): Payer: BC Managed Care – PPO | Admitting: Family

## 2023-06-08 VITALS — BP 100/74 | HR 73 | Temp 97.7°F | Ht 62.0 in | Wt 151.4 lb

## 2023-06-08 DIAGNOSIS — L659 Nonscarring hair loss, unspecified: Secondary | ICD-10-CM

## 2023-06-08 DIAGNOSIS — E559 Vitamin D deficiency, unspecified: Secondary | ICD-10-CM | POA: Diagnosis not present

## 2023-06-08 DIAGNOSIS — Z8349 Family history of other endocrine, nutritional and metabolic diseases: Secondary | ICD-10-CM

## 2023-06-08 DIAGNOSIS — E782 Mixed hyperlipidemia: Secondary | ICD-10-CM

## 2023-06-08 DIAGNOSIS — Z114 Encounter for screening for human immunodeficiency virus [HIV]: Secondary | ICD-10-CM

## 2023-06-08 DIAGNOSIS — Z0000 Encounter for general adult medical examination without abnormal findings: Secondary | ICD-10-CM | POA: Diagnosis not present

## 2023-06-08 DIAGNOSIS — Z23 Encounter for immunization: Secondary | ICD-10-CM | POA: Diagnosis not present

## 2023-06-08 DIAGNOSIS — Z1159 Encounter for screening for other viral diseases: Secondary | ICD-10-CM

## 2023-06-08 LAB — CBC WITH DIFFERENTIAL/PLATELET
Basophils Absolute: 0 10*3/uL (ref 0.0–0.1)
Basophils Relative: 0.5 % (ref 0.0–3.0)
Eosinophils Absolute: 0.1 10*3/uL (ref 0.0–0.7)
Eosinophils Relative: 0.9 % (ref 0.0–5.0)
HCT: 39.2 % (ref 36.0–46.0)
Hemoglobin: 12.8 g/dL (ref 12.0–15.0)
Lymphocytes Relative: 36 % (ref 12.0–46.0)
Lymphs Abs: 2.9 10*3/uL (ref 0.7–4.0)
MCHC: 32.6 g/dL (ref 30.0–36.0)
MCV: 82 fL (ref 78.0–100.0)
Monocytes Absolute: 0.4 10*3/uL (ref 0.1–1.0)
Monocytes Relative: 4.9 % (ref 3.0–12.0)
Neutro Abs: 4.6 10*3/uL (ref 1.4–7.7)
Neutrophils Relative %: 57.7 % (ref 43.0–77.0)
Platelets: 303 10*3/uL (ref 150.0–400.0)
RBC: 4.78 Mil/uL (ref 3.87–5.11)
RDW: 14.5 % (ref 11.5–15.5)
WBC: 8 10*3/uL (ref 4.0–10.5)

## 2023-06-08 LAB — COMPREHENSIVE METABOLIC PANEL
ALT: 12 U/L (ref 0–35)
AST: 13 U/L (ref 0–37)
Albumin: 4.5 g/dL (ref 3.5–5.2)
Alkaline Phosphatase: 54 U/L (ref 39–117)
BUN: 11 mg/dL (ref 6–23)
CO2: 25 meq/L (ref 19–32)
Calcium: 9.4 mg/dL (ref 8.4–10.5)
Chloride: 102 meq/L (ref 96–112)
Creatinine, Ser: 0.68 mg/dL (ref 0.40–1.20)
GFR: 111.7 mL/min (ref >60.00)
Glucose, Bld: 88 mg/dL (ref 70–99)
Potassium: 3.7 meq/L (ref 3.5–5.1)
Sodium: 135 meq/L (ref 135–145)
Total Bilirubin: 0.9 mg/dL (ref 0.2–1.2)
Total Protein: 7.6 g/dL (ref 6.0–8.3)

## 2023-06-08 LAB — LIPID PANEL
Cholesterol: 238 mg/dL — ABNORMAL HIGH (ref 0–200)
HDL: 43.3 mg/dL (ref >39.00)
LDL Cholesterol: 173 mg/dL — ABNORMAL HIGH (ref 0–99)
NonHDL: 194.94
Total CHOL/HDL Ratio: 6
Triglycerides: 109 mg/dL (ref 0.0–149.0)
VLDL: 21.8 mg/dL (ref 0.0–40.0)

## 2023-06-08 LAB — TSH: TSH: 2.62 u[IU]/mL (ref 0.35–5.50)

## 2023-06-08 LAB — VITAMIN D 25 HYDROXY (VIT D DEFICIENCY, FRACTURES): VITD: 23.86 ng/mL — ABNORMAL LOW (ref 30.00–100.00)

## 2023-06-08 NOTE — Progress Notes (Signed)
Phone [phone]  Subjective:   Patient is a 37 y.o. female presenting for annual physical.    Chief Complaint  Patient presents with   Annual Exam    Pt here for Annual Exam and is currently fasting    Discussed the use of AI scribe software for clinical note transcription with the patient, who gave verbal consent to proceed.  History of Present Illness   The patient, with a history of hyperlipidemia, presents with left-sided neck and shoulder pain. She describes the pain as localized to the left side, without radiation or associated numbness or tingling. The pain is exacerbated by driving and wearing tight clothing, such as a sports bra. She denies any associated head pain or changes in range of motion, but reports tenderness upon palpation. She also reports a clicking sound in her knee when walking, but denies associated pain or previous injury. She also reports occasional stiffness in her right leg, which varies in severity. An MRI was previously performed, but the patient did not follow up on the results. The patient also expresses concern about thinning hair, which she attributes to a family history of female pattern baldness. She denies any patches of hair loss. Finally, the patient reports recent weight loss and reduced caffeine intake, which she believes has improved her blood pressure. She denies any changes in menstrual cycle, alcohol intake, or diet.     See problem oriented charting- ROS- full  review of systems was completed and negative  The following were reviewed and entered/updated in epic: History reviewed. No pertinent past medical history. Patient Active Problem List   Diagnosis Date Noted   Vitamin D insufficiency 07/23/2020   Bilateral sciatica 08/20/2018   Comedonal acne 01/26/2018   Irritable bowel syndrome with diarrhea 01/26/2018   Seborrheic dermatitis of scalp 01/26/2018   Past Surgical History:  Procedure Laterality Date   EXTRACORPOREAL SHOCK WAVE  LITHOTRIPSY Right 05/04/2020   Procedure: EXTRACORPOREAL SHOCK WAVE LITHOTRIPSY (ESWL);  Surgeon: Rene Paci, MD;  Location: [facility];  Service: Urology;  Laterality: Right;    History reviewed. No pertinent family history.  Medications- reviewed and updated No current outpatient medications on file.   No current facility-administered medications for this visit.    Allergies-reviewed and updated Allergies  Allergen Reactions   Ibuprofen     GI upset    Social History   Social History Narrative   Not on file    Objective:  BP 100/74   Pulse 73   Temp 97.7 F (36.5 C)   Ht 5\' 2"  (1.575 m)   Wt 151 lb 6.4 oz (68.7 kg)   SpO2 99%   BMI 27.69 kg/m  Physical Exam Vitals and nursing note reviewed.  Constitutional:      Appearance: Normal appearance.  HENT:     Head: Normocephalic.     Right Ear: Tympanic membrane normal.     Left Ear: Tympanic membrane normal.     Nose: Nose normal.     Mouth/Throat:     Mouth: Mucous membranes are moist.  Eyes:     Pupils: Pupils are equal, round, and reactive to light.  Cardiovascular:     Rate and Rhythm: Normal rate and regular rhythm.  Pulmonary:     Effort: Pulmonary effort is normal.     Breath sounds: Normal breath sounds.  Musculoskeletal:        General: Normal range of motion.     Cervical back: Normal range of motion.  Lymphadenopathy:  Cervical: No cervical adenopathy.  Skin:    General: Skin is warm and dry.  Neurological:     Mental Status: She is alert.  Psychiatric:        Mood and Affect: Mood normal.        Behavior: Behavior normal.      Assessment and Plan   Health Maintenance counseling: 1. Anticipatory guidance: Patient counseled regarding regular dental exams q6 months, eye exams,  avoiding smoking and second hand smoke, limiting alcohol to 1 beverage per day, no illicit drugs.   2. Risk factor reduction:  Advised patient of need for regular exercise and diet  rich with fruits and vegetables to reduce risk of heart attack and stroke. Wt Readings from Last 3 Encounters:  06/08/23 151 lb 6.4 oz (68.7 kg)  06/15/22 153 lb 6 oz (69.6 kg)  05/04/20 141 lb 4.8 oz (64.1 kg)   3. Immunizations/screenings/ancillary studies Immunization History  Administered Date(s) Administered   Influenza Inj Mdck Quad Pf 04/22/2015, 04/08/2017, 05/10/2019, 04/06/2020, 03/29/2021   Influenza, Seasonal, Injecte, Preservative Fre 06/08/2023   Influenza,inj,Quad PF,6+ Mos 05/11/2022   Moderna Sars-Covid-2 Vaccination 09/19/2019, 10/10/2019   Tdap 10/20/2014   Health Maintenance Due  Topic Date Due   HIV Screening  Never done    4. Cervical cancer screening: done 2023 5. Skin cancer screening- advised regular sunscreen use. Denies worrisome, changing, or new skin lesions.  6. Birth control/STD check: N/A 7. Smoking associated screening: non- smoker 8. Alcohol screening: rare  Assessment and Plan    Neck Pain - Localized to left side, exacerbated by driving and wearing tight clothing. No radiation, numbness, or tingling. Pain with neck rotation. -Apply heat for 20-30 minutes at a time tid. -Consider OTC analgesic rubs or lidocaine cream/patch tid prn. -Practice range of motion activities and maintaining good posture/ergonomics when working/driving. -Call office if sx are not improving.  Hyperlipidemia - Discussed dietary changes to lower cholesterol. Patient has made lifestyle modifications including weight loss. -Recheck cholesterol levels today. -F/U 1 year  Hair Thinning - Patient reports thinning hair, no patches. Family history of female pattern baldness. -Check thyroid & biotin levels. -Consider biotin supplementation if levels are low. -Discussed over-the-counter options like Rogaine (Minoxidil) to apply topically to scalp.  Annual Physical -  -Check metabolic panel, Vit D, blood count, also Hepatitis C & HIV screenings today. -Encourage continued  lifestyle modifications including diet, exercise, and hydration.     Immunization due  - Flu vaccine trivalent PF, 6mos and older(Flulaval,Afluria,Fluarix,Fluzone)  Recommended follow up:  Return for any future concerns, Complete physical w/fasting labs. No future appointments.  Lab/Order associations:fasting    Dulce Sellar, NP

## 2023-06-08 NOTE — Patient Instructions (Signed)
 It was very nice to see you today!   I will review your lab results via MyChart in a few days.   Hope you have a wonderful holiday season!      PLEASE NOTE:  If you had any lab tests please let us know if you have not heard back within a few days. You may see your results on MyChart before we have a chance to review them but we will give you a call once they are reviewed by [place]. If we ordered any referrals today, please let us know if you have not heard from their office within the next week.

## 2023-06-22 LAB — BIOTIN (VITAMIN B7): Biotin (Vitamin B7): 1919.8 pg/mL (ref 221.0–3004.0)

## 2023-06-22 LAB — HIV ANTIBODY (ROUTINE TESTING W REFLEX): HIV 1&2 Ab, 4th Generation: NONREACTIVE

## 2023-06-22 LAB — HEPATITIS C ANTIBODY: Hepatitis C Ab: NONREACTIVE

## 2024-03-07 ENCOUNTER — Ambulatory Visit (INDEPENDENT_AMBULATORY_CARE_PROVIDER_SITE_OTHER): Admitting: Family

## 2024-03-07 ENCOUNTER — Encounter: Payer: Self-pay | Admitting: Family

## 2024-03-07 VITALS — BP 134/88 | HR 70 | Temp 97.3°F | Ht 62.0 in | Wt 153.2 lb

## 2024-03-07 DIAGNOSIS — L02411 Cutaneous abscess of right axilla: Secondary | ICD-10-CM | POA: Diagnosis not present

## 2024-03-07 DIAGNOSIS — G9332 Myalgic encephalomyelitis/chronic fatigue syndrome: Secondary | ICD-10-CM | POA: Insufficient documentation

## 2024-03-07 MED ORDER — DOXYCYCLINE HYCLATE 100 MG PO TABS: 100.0000 mg | ORAL_TABLET | Freq: Two times a day (BID) | ORAL | 0 refills | Status: DC

## 2024-03-07 NOTE — Progress Notes (Signed)
 Patient ID: Colleen James, female    DOB: [birth year], 38 y.o.   MRN: [id number]  Chief Complaint  Patient presents with   Mass    Pt c/o lump with pus under right armpit. Pt denies any pain at this time. Present for a few months.   Discussed the use of AI scribe software for clinical note transcription with the patient, who gave verbal consent to proceed.  History of Present Illness   Colleen James is a 38 year old female with diabetes who presents with a persistent lump under her arm.  Axillary subcutaneous mass - Hard, non-painful lump under the arm present for several months - No change in size or appearance over time - Subcutaneous location - No overlying erythema or white head formation - Recently developed tenderness, especially with pressure - This morning, developed a pimple-like sensation in the area, which is now tender and sensitive  Recent topical and mechanical exposures - Recently started using a new deodorant - Currently using a razor for hair removal, previously used waxing  Medication allergies - Allergic to ibuprofen - No known allergies to antibiotics  Diabetes mellitus - No History of diabetes     Assessment and Plan    Abscess of right axilla Appears as hidradenitis suppurativa with abscess formation approx 1.5cm mildly raised, no erythema noted. Currently spontaneously draining small amount of pus, only able to express a small amount.  Denies any hx of similar lumps. Discussed weight gain as a risk factor for this condition.  - Prescribed doxycycline  100mg  bid x7d. - Advised against deodorant use until healed. - Recommended cleaning under both axillae with Hibiclens soap exfoliating with a loufah sponge or washcloth daily. - Handout provided for condition. - Provided gauze for drainage. - Instructed to report if no improvement. - Consider referral to general surgeon for frequent recurrence.     Subjective:    No outpatient medications  prior to visit.   No facility-administered medications prior to visit.   Past Medical History:  Diagnosis Date   Chronic fatigue syndrome 03/07/2024   Past Surgical History:  Procedure Laterality Date   EXTRACORPOREAL SHOCK WAVE LITHOTRIPSY Right 05/04/2020   Procedure: EXTRACORPOREAL SHOCK WAVE LITHOTRIPSY (ESWL);  Surgeon: Devere Lonni Righter, MD;  Location: [facility];  Service: Urology;  Laterality: Right;   Allergies  Allergen Reactions   Ibuprofen Other (See Comments)    GI upset      Objective:    Physical Exam Vitals and nursing note reviewed.  Constitutional:      Appearance: Normal appearance.  Cardiovascular:     Rate and Rhythm: Normal rate and regular rhythm.  Pulmonary:     Effort: Pulmonary effort is normal.     Breath sounds: Normal breath sounds.  Musculoskeletal:        General: Normal range of motion.  Skin:    General: Skin is warm and dry.     Findings: Abscess (under right axilla, approx 1.5cm mildly raised, no erythema noted. Currently spontaneously draining small amount of pus,) present.  Neurological:     Mental Status: She is alert.  Psychiatric:        Mood and Affect: Mood normal.        Behavior: Behavior normal.    BP 134/88   Pulse 70   Temp (!) 97.3 F (36.3 C) (Temporal)   Ht 5' 2 (1.575 m)   Wt 153 lb 3.2 oz (69.5 kg)   LMP 02/22/2024 (Approximate)   SpO2  100%   BMI 28.02 kg/m  Wt Readings from Last 3 Encounters:  03/07/24 153 lb 3.2 oz (69.5 kg)  06/08/23 151 lb 6.4 oz (68.7 kg)  06/15/22 153 lb 6 oz (69.6 kg)      Lucius Krabbe, NP

## 2024-03-13 ENCOUNTER — Ambulatory Visit (INDEPENDENT_AMBULATORY_CARE_PROVIDER_SITE_OTHER): Admitting: Family

## 2024-03-13 ENCOUNTER — Encounter: Payer: Self-pay | Admitting: Family

## 2024-03-13 VITALS — BP 115/82 | HR 72 | Temp 97.7°F | Ht 62.0 in | Wt 153.4 lb

## 2024-03-13 DIAGNOSIS — L02411 Cutaneous abscess of right axilla: Secondary | ICD-10-CM | POA: Diagnosis not present

## 2024-03-13 MED ORDER — DOXYCYCLINE HYCLATE 100 MG PO TABS: 100.0000 mg | ORAL_TABLET | Freq: Two times a day (BID) | ORAL | 0 refills | Status: AC

## 2024-03-13 NOTE — Progress Notes (Signed)
 Patient ID: Colleen James, female    DOB: [birth year], 38 y.o.   MRN: [id number]  Chief Complaint  Patient presents with   Abscess of axilla, right    Pt c/o white pus, Pt states she has 1 more day left of abx.   Discussed the use of AI scribe software for clinical note transcription with the patient, who gave verbal consent to proceed.  History of Present Illness   Colleen James is a 38 year old female who presents with a draining lump under her arm.  James was seen by me a week ago for same lump. It has continued to drain a small amount of pus daily. She reports taking the Doxycycline  daily as directed, with one day remaining, initially noticing little improvement, but some improvement noted in the last one and a half days with the size. The lump is slightly reduced in size.   The lump is non-painful, even under pressure, but palpable. She tolerates the antibiotic well, despite initial stomach discomfort.  She avoids deodorant and uses the Hibiclens cleanser as recommended. She is concerned about recurrence and seeks a dermatologist's evaluation, having seen one previously but unable to secure a recent appointment.     Assessment and Plan    Cutaneous abscess of right axilla Abscess improving with reduced size and drainage, minimal inflammation, possibly an infected lymph node. Punctured abscess with sterile 18g needle after cleaning area with alcohol, per patient verbal consent, and able to obtain a small amount of white pus mixed with blood,  wound cleaned with alcohol and bandaid applied. Approx. 2cm induration noted after. - Refilling DOXY  for one more week.  - Sending referral to [facility] in [place], pt willing to travel. - Advise keeping area clean and dry, avoid deodorant, continue using Hibiclens daily in shower. - Apply gauze or Band-Aid as needed for drainage. - Call office if any worsening of symptoms prior to seeing Derm.      Subjective:     Outpatient Medications Prior to Visit  Medication Sig Dispense Refill   doxycycline  (VIBRA -TABS) 100 MG tablet Take 1 tablet (100 mg total) by mouth 2 (two) times daily for 7 days. 14 tablet 0   No facility-administered medications prior to visit.   Past Medical History:  Diagnosis Date   Chronic fatigue syndrome 03/07/2024   Past Surgical History:  Procedure Laterality Date   EXTRACORPOREAL SHOCK WAVE LITHOTRIPSY Right 05/04/2020   Procedure: EXTRACORPOREAL SHOCK WAVE LITHOTRIPSY (ESWL);  Surgeon: Devere Lonni Righter, MD;  Location: [facility];  Service: Urology;  Laterality: Right;   Allergies  Allergen Reactions   Ibuprofen Other (See Comments)    GI upset      Objective:    Physical Exam Vitals and nursing note reviewed.  Constitutional:      Appearance: Normal appearance.  Cardiovascular:     Rate and Rhythm: Normal rate and regular rhythm.  Pulmonary:     Effort: Pulmonary effort is normal.     Breath sounds: Normal breath sounds.  Musculoskeletal:        General: Normal range of motion.  Skin:    General: Skin is warm and dry.     Findings: Abscess (under right axilla, approx 2cm mildly raised, no erythema noted, currently spontaneously draining scant amount of white pus) present.  Neurological:     Mental Status: She is alert.  Psychiatric:        Mood and Affect: Mood normal.  Behavior: Behavior normal.    BP 115/82 (BP Location: Left Arm, Patient Position: Sitting, Cuff Size: Normal)   Pulse 72   Temp 97.7 F (36.5 C) (Temporal)   Ht 5' 2 (1.575 m)   Wt 153 lb 6.4 oz (69.6 kg)   LMP 02/22/2024 (Approximate)   SpO2 100%   BMI 28.06 kg/m  Wt Readings from Last 3 Encounters:  03/13/24 153 lb 6.4 oz (69.6 kg)  03/07/24 153 lb 3.2 oz (69.5 kg)  06/08/23 151 lb 6.4 oz (68.7 kg)       Lucius Krabbe, NP

## 2024-04-11 ENCOUNTER — Ambulatory Visit (INDEPENDENT_AMBULATORY_CARE_PROVIDER_SITE_OTHER): Admitting: Family

## 2024-04-11 ENCOUNTER — Encounter: Payer: Self-pay | Admitting: Family

## 2024-04-11 ENCOUNTER — Ambulatory Visit: Payer: Self-pay

## 2024-04-11 ENCOUNTER — Ambulatory Visit: Admitting: Family

## 2024-04-11 VITALS — BP 128/88 | HR 67 | Temp 97.9°F | Ht 62.0 in | Wt 154.0 lb

## 2024-04-11 DIAGNOSIS — K921 Melena: Secondary | ICD-10-CM | POA: Diagnosis not present

## 2024-04-11 NOTE — Progress Notes (Signed)
 Patient ID: Colleen James, female    DOB: 1985/08/11, 38 y.o.   MRN: [id number]  Chief Complaint  Patient presents with   Blood In Stools    Pt c/o Blood in stools, Present since this morning. Pain and discomfort in rectum, present for 3 days.   Discussed the use of AI scribe software for clinical note transcription with the patient, who gave verbal consent to proceed.  History of Present Illness Colleen James is a 38 year old female who presents with rectal bleeding and a persistent lump on the rectum.  Bloody stool is present, first noticed this morning, characterized by blood streaks mixed in her stool during bowel movement, specifically when wiping, and blood mixed in the stool as thin streaks. There is no itching or burning, but sensitivity is noted in the area.   A lump on the rectum, described as soft and wrinkly, has been present for many years with an increase in size over time. There is no history of thrombosed hemorrhoids, and it is unclear if the lump is a hemorrhoid. Recent constipation with hard stools is noted, though there is no straining during bowel movements, and stools are not consistently hard. Six weeks ago, she was treated for an axillae abscess with antibiotics and a steroid injection into the lump, resulting in minimal improvement.  Assessment & Plan Blood in stool Denies red water in toilet bowl after BM and no clots seen, reports a few streaks in her stool,  likely due to internal hemorrhoids or mucosal irritation from hard stool. No thrombosed hemorrhoid or tenderness with palpation noted, just small skin tag. - Recommend OTC generic Preparation H suppositories, one at night for a 3-4 nights. - Can also use OTC generic Prep H cream for rectal itching or burning. - Advised to call if symptoms persist or worsen.  Rectal skin tag Small, soft, painless rectal skin tag. Reassured this is a normal finding, no workup needed.   Subjective:     No outpatient medications prior to visit.   No facility-administered medications prior to visit.   Past Medical History:  Diagnosis Date   Chronic fatigue syndrome 03/07/2024   Past Surgical History:  Procedure Laterality Date   EXTRACORPOREAL SHOCK WAVE LITHOTRIPSY Right 05/04/2020   Procedure: EXTRACORPOREAL SHOCK WAVE LITHOTRIPSY (ESWL);  Surgeon: Devere Lonni Righter, MD;  Location: [facility];  Service: Urology;  Laterality: Right;   Allergies  Allergen Reactions   Ibuprofen Other (See Comments)    GI upset      Objective:    Physical Exam Vitals and nursing note reviewed.  Constitutional:      Appearance: Normal appearance.  Cardiovascular:     Rate and Rhythm: Normal rate and regular rhythm.  Pulmonary:     Effort: Pulmonary effort is normal.     Breath sounds: Normal breath sounds.  Genitourinary:    Rectum: No mass, tenderness, anal fissure, external hemorrhoid or internal hemorrhoid.  Musculoskeletal:        General: Normal range of motion.  Skin:    General: Skin is warm and dry.     Findings: Lesion (small skin tag on right side of anus) present.  Neurological:     Mental Status: She is alert.  Psychiatric:        Mood and Affect: Mood normal.        Behavior: Behavior normal.    BP 128/88 (BP Location: Left Arm, Patient Position: Sitting, Cuff Size: Normal)   Pulse 67  Temp 97.9 F (36.6 C) (Temporal)   Ht 5' 2 (1.575 m)   Wt 154 lb (69.9 kg)   LMP 03/13/2024 (Approximate)   SpO2 97%   BMI 28.17 kg/m  Wt Readings from Last 3 Encounters:  04/11/24 154 lb (69.9 kg)  03/13/24 153 lb 6.4 oz (69.6 kg)  03/07/24 153 lb 3.2 oz (69.5 kg)      Lucius Krabbe, NP

## 2024-04-11 NOTE — Telephone Encounter (Signed)
 FYI Only or Action Required?: FYI only for provider.  Patient was last seen in primary care on 03/13/2024 by Lucius Krabbe, NP.  Called Nurse Triage reporting Rectal Bleeding.  Symptoms began several days ago.  Interventions attempted: Nothing.  Symptoms are: stable.  Triage Disposition: See PCP When Office is Open (Within 3 Days)  Patient/caregiver understands and will follow disposition?: Yes Reason for Disposition  MILD rectal bleeding (e.g., more than just a few drops or streaks)  Answer Assessment - Initial Assessment Questions Noticed slight discomfort after using the bathroom, denies history of hemorrhoids, is unsure, but states has a small lump on the outside entrance of anus.   1. APPEARANCE of BLOOD: What color is it? Is it passed separately, on the surface of the stool, or mixed in with the stool?      Bright red  2. AMOUNT: How much blood was passed?      Noticed it in stool, and when wiping  3. FREQUENCY: How many times has blood been passed with the stools?      A few times  4. ONSET: When was the blood first seen in the stools? (Days or weeks)      Few days ago   5. DIARRHEA: Is there also some diarrhea? If Yes, ask: How many diarrhea stools in the past 24 hours?      Denies  6. CONSTIPATION: Do you have constipation? If Yes, ask: How bad is it?     Denies  7. RECURRENT SYMPTOMS: Have you had blood in your stools before? If Yes, ask: When was the last time? and What happened that time?      Denies  8. OTHER SYMPTOMS: Do you have any other symptoms?  (e.g., abdomen pain, vomiting, dizziness, fever)     Denies  Protocols used: Rectal Bleeding-A-AH  Copied from CRM #[id number]. Topic: Clinical - Red Word Triage >> Apr 11, 2024 10:12 AM Ashley SAUNDERS wrote: [facility] that prompted transfer to Nurse Triage: Blood while using the restroom

## 2024-04-11 NOTE — Telephone Encounter (Signed)
 Appt today

## 2024-06-18 ENCOUNTER — Encounter (HOSPITAL_BASED_OUTPATIENT_CLINIC_OR_DEPARTMENT_OTHER): Payer: Self-pay | Admitting: Obstetrics and Gynecology

## 2024-06-18 ENCOUNTER — Ambulatory Visit (HOSPITAL_BASED_OUTPATIENT_CLINIC_OR_DEPARTMENT_OTHER): Admitting: Obstetrics and Gynecology

## 2024-06-18 VITALS — BP 141/102 | HR 76 | Ht 60.5 in | Wt 154.0 lb

## 2024-06-18 DIAGNOSIS — Z1331 Encounter for screening for depression: Secondary | ICD-10-CM | POA: Diagnosis not present

## 2024-06-18 DIAGNOSIS — Z01419 Encounter for gynecological examination (general) (routine) without abnormal findings: Secondary | ICD-10-CM | POA: Diagnosis not present

## 2024-06-18 DIAGNOSIS — Z8639 Personal history of other endocrine, nutritional and metabolic disease: Secondary | ICD-10-CM | POA: Diagnosis not present

## 2024-06-18 DIAGNOSIS — G43829 Menstrual migraine, not intractable, without status migrainosus: Secondary | ICD-10-CM

## 2024-06-18 NOTE — Progress Notes (Unsigned)
 "  ANNUAL EXAM Patient name: Shaylyn Loera MRN [id number]  Date of birth: [birth year] Chief Complaint:   New Patient (Initial Visit) (Establish Care/Discuss periods/headaches before, during or after period)  History of Present Illness:   Madelene Yankee is a 38 y.o. G63P1001  female being seen today for a routine annual exam.  Current complaints: reports that she has a headache surrounding time of her menstrual cycle, that has gotten pretty significant over the past year.  She reports she also changed her job and she is staring at computer most of day. She take medication for her headache and reports that does provide relief. She reports a previous hx of vit d deficiency.   She had an abscess under her armpit that she had drained and was taking abx for  Patient's last menstrual period was 06/14/2024.   Gynecologic History Patient's last menstrual period was . Contraception: condoms  Last Pap:06/15/22. Results were: NILM Last mammogram: n/a     06/18/2024    3:42 PM 06/08/2023    1:11 PM 06/15/2022    3:38 PM  Depression screen PHQ 2/9  Decreased Interest 0 0 0  Down, Depressed, Hopeless 0 0 0  PHQ - 2 Score 0 0 0  Altered sleeping 1    Tired, decreased energy 1    Change in appetite 0    Feeling bad or failure about yourself  0    Moving slowly or fidgety/restless 0    Suicidal thoughts 0    PHQ-9 Score 2          06/18/2024    3:42 PM 06/08/2023    1:11 PM  GAD 7 : Generalized Anxiety Score  Nervous, Anxious, on Edge 0 0  Control/stop worrying 0 0  Worry too much - different things 0 0  Trouble relaxing 0 0  Restless 0 0  Easily annoyed or irritable 0 0  Afraid - awful might happen 0 0  Total GAD 7 Score 0 0  Anxiety Difficulty  Not difficult at all    Review of Systems:   Pertinent items are noted in HPI Denies any headaches, blurred vision, fatigue, shortness of breath, chest pain, abdominal pain, abnormal vaginal discharge/itching/odor/irritation,  problems with periods, bowel movements, urination, or intercourse unless otherwise stated above. Pertinent History Reviewed:  Reviewed past medical,surgical, social and family history.  Reviewed problem list, medications and allergies. Physical Assessment:   Vitals:   06/18/24 1535  BP: (!) 141/102  Pulse: 76  Weight: 154 lb (69.9 kg)  Height: 5' 0.5 (1.537 m)  Body mass index is 29.58 kg/m.        Physical Examination:   General appearance - well appearing, and in no distress  Mental status - alert, oriented   Psych:  She has a normal mood and affect  Skin - warm and dry, normal color  Chest - effort normal, all lung fields clear to auscultation bilaterally  Heart - normal rate and regular rhythm  Neck:  midline trachea, no thyromegaly or nodules  Breasts - breasts appear normal, no suspicious masses, no skin or nipple changes or  axillary nodes  Abdomen - soft, nontender, nondistended  Pelvic - deferred  Extremities:  No swelling or varicosities noted  Chaperone present for exam  No results found for this or any previous visit (from the past 24 hours).  Assessment & Plan:  1. Encounter for annual routine gynecological examination (Primary) UTD on pap smear Discussed mammogram at age 44 Discussed expected changes for  perimenopause   - CBC - Comp Met (CMET) - TSH - Vitamin D  (25 hydroxy)  2. History of vitamin D  deficiency Checking today  - Vitamin D  (25 hydroxy)  3. Menstrual migraine without status migrainosus, not intractable Discussed options to manage headache surrounding cycle including hormonal and non hormonal management. At this time she desires OTC treatment that is helping right now, also discussed hydration and excedrine tension/migraine.   Labs/procedures today:   Mammogram: @ 38yo, or sooner if problems Colonoscopy: @ 38yo, or sooner if problems  Orders Placed This Encounter  Procedures   CBC   Comp Met (CMET)   TSH   Vitamin D  (25 hydroxy)     Meds: No orders of the defined types were placed in this encounter.   Follow-up: Return in about 1 year (around 06/18/2025) for [place] Nidia Delores, FNP  "

## 2024-06-19 ENCOUNTER — Ambulatory Visit: Payer: Self-pay | Admitting: Obstetrics and Gynecology

## 2024-06-19 DIAGNOSIS — E559 Vitamin D deficiency, unspecified: Secondary | ICD-10-CM

## 2024-06-19 LAB — CBC
Hematocrit: 38.9 % (ref 34.0–46.6)
Hemoglobin: 12.6 g/dL (ref 11.1–15.9)
MCH: 27.5 pg (ref 26.6–33.0)
MCHC: 32.4 g/dL (ref 31.5–35.7)
MCV: 85 fL (ref 79–97)
Platelets: 284 x10E3/uL (ref 150–450)
RBC: 4.59 x10E6/uL (ref 3.77–5.28)
RDW: 13.5 % (ref 11.7–15.4)
WBC: 7.1 x10E3/uL (ref 3.4–10.8)

## 2024-06-19 LAB — COMPREHENSIVE METABOLIC PANEL WITH GFR
ALT: 15 IU/L (ref 0–32)
AST: 17 IU/L (ref 0–40)
Albumin: 4.5 g/dL (ref 3.9–4.9)
Alkaline Phosphatase: 62 IU/L (ref 41–116)
BUN/Creatinine Ratio: 21 (ref 9–23)
BUN: 14 mg/dL (ref 6–20)
Bilirubin Total: 0.5 mg/dL (ref 0.0–1.2)
CO2: 21 mmol/L (ref 20–29)
Calcium: 9.6 mg/dL (ref 8.7–10.2)
Chloride: 100 mmol/L (ref 96–106)
Creatinine, Ser: 0.67 mg/dL (ref 0.57–1.00)
Globulin, Total: 2.8 g/dL (ref 1.5–4.5)
Glucose: 88 mg/dL (ref 70–99)
Potassium: 3.8 mmol/L (ref 3.5–5.2)
Sodium: 138 mmol/L (ref 134–144)
Total Protein: 7.3 g/dL (ref 6.0–8.5)
eGFR: 115 mL/min/1.73

## 2024-06-19 LAB — TSH: TSH: 1.5 u[IU]/mL (ref 0.450–4.500)

## 2024-06-19 LAB — VITAMIN D 25 HYDROXY (VIT D DEFICIENCY, FRACTURES): Vit D, 25-Hydroxy: 12.7 ng/mL — ABNORMAL LOW (ref 30.0–100.0)

## 2024-06-19 MED ORDER — CHOLECALCIFEROL 25 MCG (1000 UT) PO CAPS: 1000.0000 [IU] | ORAL_CAPSULE | Freq: Every day | ORAL | 2 refills | Status: AC
